# Patient Record
Sex: Male | Born: 1985 | Race: White | Marital: Single | Smoking: Never smoker
Health system: Southern US, Community
[De-identification: ages and names within clinical notes are randomized; demographics above are authoritative.]

## PROBLEM LIST (undated history)

## (undated) DIAGNOSIS — M25562 Pain in left knee: Principal | ICD-10-CM

## (undated) HISTORY — DX: Pain in left knee: M25.562

## (undated) HISTORY — PX: KNEE SURGERY: SHX244

---

## 2006-01-11 HISTORY — PX: WISDOM TOOTH EXTRACTION: SHX21

## 2011-06-24 ENCOUNTER — Other Ambulatory Visit: Payer: Self-pay | Admitting: Occupational Medicine

## 2011-06-24 ENCOUNTER — Ambulatory Visit
Admission: RE | Admit: 2011-06-24 | Discharge: 2011-06-24 | Disposition: A | Discharge status: Home / Self Care | Payer: No Typology Code available for payment source | Source: Ambulatory Visit | Attending: Occupational Medicine | Admitting: Occupational Medicine

## 2011-06-24 DIAGNOSIS — Z021 Encounter for pre-employment examination: Secondary | ICD-10-CM

## 2014-11-22 ENCOUNTER — Ambulatory Visit (INDEPENDENT_AMBULATORY_CARE_PROVIDER_SITE_OTHER): Payer: 59

## 2014-11-22 ENCOUNTER — Ambulatory Visit (INDEPENDENT_AMBULATORY_CARE_PROVIDER_SITE_OTHER): Payer: 59 | Admitting: Osteopathic Medicine

## 2014-11-22 ENCOUNTER — Encounter: Payer: Self-pay | Admitting: Osteopathic Medicine

## 2014-11-22 VITALS — BP 144/76 | HR 65 | Ht 69.0 in | Wt 160.0 lb

## 2014-11-22 DIAGNOSIS — M25562 Pain in left knee: Secondary | ICD-10-CM | POA: Diagnosis not present

## 2014-11-22 DIAGNOSIS — M25462 Effusion, left knee: Secondary | ICD-10-CM | POA: Diagnosis not present

## 2014-11-22 HISTORY — DX: Pain in left knee: M25.562

## 2014-11-22 MED ORDER — TRAMADOL HCL 50 MG PO TABS: 50.0000 mg | ORAL_TABLET | Freq: Four times a day (QID) | ORAL | Status: AC | PRN

## 2014-11-22 NOTE — Progress Notes (Signed)
HPI: Austin Roman is a 29 y.o. male who presents [facility]  today for chief complaint of: No chief complaint on file.  . Location: L knee.  . Quality: soreness, "pain" . Severity: moderate to severe . Duration: 7 years . Timing: worse with motion, otherwise ok . 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  . Modifying factors: Tylenol and Ibuprofen not really helping.  . Assoc signs/symptoms: no falling, no locking   Past medical, social and family history reviewed: History reviewed. No pertinent past medical history. History reviewed. No pertinent past surgical history. Social History  Substance Use Topics  . Smoking status: Not on file  . Smokeless tobacco: Not on file  . Alcohol Use: Not on file   History reviewed. No pertinent family history.  No current outpatient prescriptions on file.   No current facility-administered medications for this visit.   No Known Allergies    Review of Systems: CONSTITUTIONAL:  No  fever, no chills, No  unintentional weight changes HEAD/EYES/EARS/NOSE/THROAT: No headache, no vision change, no hearing change, No  sore throat CARDIAC: No chest pain, no pressure/palpitations, no orthopnea RESPIRATORY: No  cough, No  shortness of breath/wheeze GASTROINTESTINAL: No nausea, no vomiting, no abdominal pain, no blood in stool, no diarrhea, no constipation MUSCULOSKELETAL: Yes  myalgia/arthralgia GENITOURINARY: No incontinence, No abnormal genital bleeding/discharge SKIN: No rash/wounds/concerning lesions HEM/ONC: No easy bruising/bleeding, no abnormal lymph node ENDOCRINE: No polyuria/polydipsia/polyphagia, no heat/cold intolerance  NEUROLOGIC: No weakness, no dizziness, no slurred speech PSYCHIATRIC: No concerns with depression, no concerns with anxiety, no sleep problems    Exam:  There were no vitals taken for this visit. Constitutional: VSS, see  above. General Appearance: alert, well-developed, well-nourished, NAD Eyes: Normal lids and conjunctive, non-icteric sclera,  Ears, Nose, Mouth, Throat: Normal external inspection ears/nares/mouth/lips/gums, TM exam deferred bilaterally, MMM, Respiratory: Normal respiratory effort. no wheeze, no rhonchi, no rales Cardiovascular: S1/S2 normal, no murmur, no rub/gallop auscultated. RRR.  Musculoskeletal: Gait normal. No clubbing/cyanosis of digits.  Neurological: No cranial nerve deficit on limited exam. Motor and sensation intact and symmetric Psychiatric: Normal judgment/insight. Normal mood and affect. Oriented x3.  KNEE EXAM:  Lachman (ACL): Neg Posterior Drawer (PCL): Neg Varus test (LCL): Neg Valgus test (MCL): Neg Apley test (meniscus nonspecific): Pos McMurray test (meniscus): Pos especially lateral knee Patella Apprehension: Neg Ober test (ITB): Neg     No results found for this or any previous visit (from the past 72 hour(s)).    ASSESSMENT/PLAN:  Left lateral knee pain - Plan: DG Knee 4 Views W/Patella Left, MR Knee Left  Wo Contrast, traMADol (ULTRAM) 50 MG tablet   Suspect meniscal tear, will obtain imaging. Patient advised knee injections are an option, he will think about this and consider setting up appointment with either me or one of the sports medicine physicians in the practice. Patient instructions printed regarding home care. Patient cleared to return to work with restrictions on squatting/kneeling motions and climbing. Patient advised to use tramadol sparingly, otherwise continue over-the-counter NSAIDs/acetaminophen, rest and ice, home exercises reviewed. Patient advised to call as if he has not heard about x-ray results were setting up an MRI by next week. Pending MRI results, may continue referral to orthopedics versus physical therapy versus other.   Return if symptoms worsen or fail to improve, for and at convenience for ANNUAL PHYSICAL.

## 2014-11-22 NOTE — Patient Instructions (Signed)
Rest the knee. Avoid positions and activities that place excessive pressure on the knee joint until pain and swelling resolve. Such activities include: squatting, kneeling, twisting and pivoting, repetitive bending (eg, stairs, getting out of a seated position, clutch and pedal pushing), jogging, dancing, and swimming using the frog or whip kick. Apply ice to the knee for 15 minutes every four to six hours, while keeping the leg elevated. Exercises attached - strengthen quadriceps to take pressure off the knee joint.

## 2014-12-02 ENCOUNTER — Ambulatory Visit (INDEPENDENT_AMBULATORY_CARE_PROVIDER_SITE_OTHER): Payer: 59

## 2014-12-02 DIAGNOSIS — M25562 Pain in left knee: Secondary | ICD-10-CM

## 2015-11-15 ENCOUNTER — Emergency Department (HOSPITAL_COMMUNITY)
Admission: EM | Admit: 2015-11-15 | Discharge: 2015-11-15 | Disposition: A | Discharge status: Home / Self Care | Payer: Worker's Compensation | Attending: Emergency Medicine | Admitting: Emergency Medicine

## 2015-11-15 ENCOUNTER — Emergency Department (HOSPITAL_COMMUNITY): Payer: Worker's Compensation

## 2015-11-15 DIAGNOSIS — W1830XA Fall on same level, unspecified, initial encounter: Secondary | ICD-10-CM | POA: Diagnosis not present

## 2015-11-15 DIAGNOSIS — Y990 Civilian activity done for income or pay: Secondary | ICD-10-CM | POA: Diagnosis not present

## 2015-11-15 DIAGNOSIS — S8992XA Unspecified injury of left lower leg, initial encounter: Secondary | ICD-10-CM | POA: Diagnosis not present

## 2015-11-15 DIAGNOSIS — Y929 Unspecified place or not applicable: Secondary | ICD-10-CM | POA: Insufficient documentation

## 2015-11-15 DIAGNOSIS — Y939 Activity, unspecified: Secondary | ICD-10-CM | POA: Insufficient documentation

## 2015-11-15 MED ORDER — KETOROLAC TROMETHAMINE 60 MG/2ML IM SOLN
60.0000 mg | Freq: Once | INTRAMUSCULAR | Status: AC
  Administered 2015-11-15: 60 mg via INTRAMUSCULAR
  Filled 2015-11-15: qty 2

## 2015-11-15 NOTE — Progress Notes (Signed)
Orthopedic Tech Progress Note Patient Details:  Austin Roman 1985-06-01 [id number]  Ortho Devices Type of Ortho Device: Crutches, Knee Immobilizer Ortho Device/Splint Location: lle Ortho Device/Splint Interventions: Ordered, Application   [name], [name] 11/15/2015, 7:24 PM

## 2015-11-15 NOTE — ED Notes (Signed)
Pt understood dc material. NAD noted. Crutches nad immobilizer placed by ortho tech prior to dc

## 2015-11-15 NOTE — ED Provider Notes (Signed)
[facility]-EMERGENCY DEPT Provider Note   CSN: [id number] Arrival date & time: 11/15/15  1705  History   Chief Complaint Chief Complaint  Patient presents with  . Knee Pain   HPI Austin Roman is a 30 y.o. male.   Knee Pain   This is a new problem. The current episode started 1 to 2 hours ago. The problem occurs constantly. The problem has not changed since onset.The quality of the pain is described as aching. The pain is at a severity of 7/10. The pain is moderate. Pertinent negatives include no numbness, no stiffness and no tingling.   Past Medical History:  Diagnosis Date  . Left lateral knee pain 11/22/2014   Suspect meniscal tear based on exam, await MRI results as of 11/22/2014   Patient Active Problem List   Diagnosis Date Noted  . Left lateral knee pain 11/22/2014   Past Surgical History:  Procedure Laterality Date  . WISDOM TOOTH EXTRACTION  2008     Home Medications    Prior to Admission medications   Medication Sig Start Date End Date Taking? Authorizing Provider  traMADol (ULTRAM) 50 MG tablet Take 1 tablet (50 mg total) by mouth every 6 (six) hours as needed for severe pain. Use sparingly 11/22/14   Sunnie NielsenNatalie Alexander, DO    Family History Family History  Problem Relation Age of Onset  . Cancer Father     SKIN  . Hypertension Maternal Grandmother   . Cancer Maternal Grandfather     PROSTATE  . Hypertension Maternal Grandfather     Social History Social History  Substance Use Topics  . Smoking status: Never Smoker  . Smokeless tobacco: Not on file  . Alcohol use Not on file     Allergies   Review of patient's allergies indicates no known allergies.   Review of Systems Review of Systems  Musculoskeletal: Negative for stiffness.  Neurological: Negative for tingling and numbness.  All other systems reviewed and are negative.    Physical Exam Updated Vital Signs BP 149/87 (BP Location: Right Arm)   Pulse 94   Temp 98.1 F (36.7 C) (Oral)    Resp 20   SpO2 97%   Physical Exam  Constitutional: He appears well-developed and well-nourished. No distress.  HENT:  Head: Normocephalic.  Eyes: EOM are normal. Pupils are equal, round, and reactive to light.  Neck: Normal range of motion.  Cardiovascular: Normal rate and regular rhythm.   Pulmonary/Chest: Effort normal.  Abdominal: He exhibits no distension.  Musculoskeletal: Normal range of motion.  Decreased ROM of left knee due to pain.   Point tenderness to medial aspect joint line without crepitance.   No obvious laxity with valgus/varus stress. Negative anterior drawer sign.   Neurological: He is alert.  Skin: Skin is warm. He is not diaphoretic. No erythema.  Psychiatric: He has a normal mood and affect. His behavior is normal.  Nursing note and vitals reviewed.  ED Treatments / Results  Radiology No results found.  Procedures Procedures (including critical care time)  Medications Ordered in ED Medications - No data to display   Initial Impression / Assessment and Plan / ED Course  I have reviewed the triage vital signs and the nursing notes.  Pertinent labs & imaging results that were available during my care of the patient were reviewed by me and considered in my medical decision making (see chart for details).  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  Underlying patient has history of left knee pain and MRI which was -2016 but states that this is different in onset. He had complete resolution of his previous pain.  Patient has no neurological deficits. Obvious deformity. Pain along the medial aspect left knee.  MCL tear possible vs strain. X-rays ordered.  X-rays negative. Will apply knee immobilizer for comfort. RICE therapy encouraged. Tylenol and motrin for pain.   Will have Orthopedic follow up for MRI if pain persists.   Patient in agreement  with plan and discharged home in good health.   Final diagnoses:  Left knee injury, initial encounter     Deirdre PeerJeremiah Rhea Thrun, MD 11/15/15 1844    Cathren LaineKevin Steinl, MD 11/15/15 2102

## 2016-07-27 IMAGING — MR MR KNEE*L* W/O CM
5 series · 40 of 40 positions shown · non-contrast
Comparison: Radiographs dated 11/22/2014

CLINICAL DATA: Constant left knee pain for 4 months.

EXAM:
MRI OF THE LEFT KNEE WITHOUT CONTRAST
TECHNIQUE: Multiplanar, multisequence MR imaging of the knee was performed. No
intravenous contrast was administered.

[Series 3: PD fat-sat · axial · 3.0mm · 0.31mm/px · z∈[-72,+37]mm · 8 of 34 slices shown (1 of 3)]
[im 1/34]
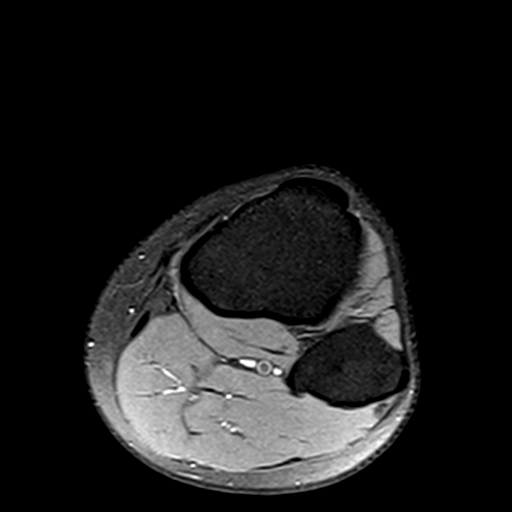
[im 5/34]
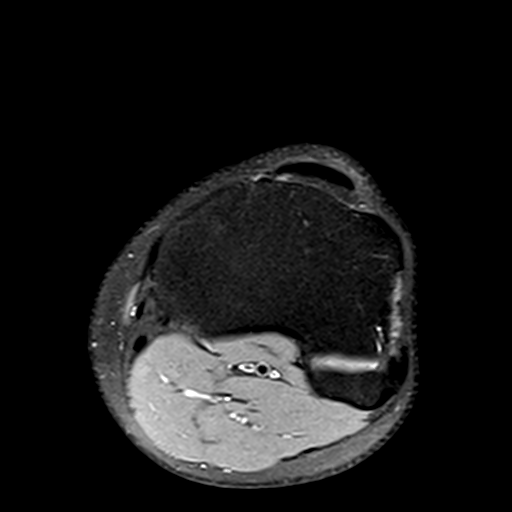
[im 10/34]
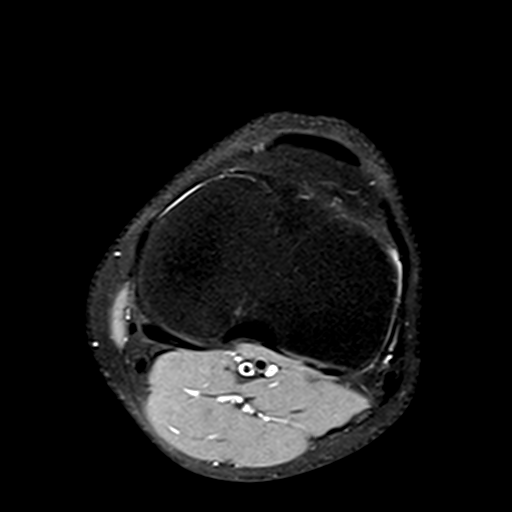
[im 15/34]
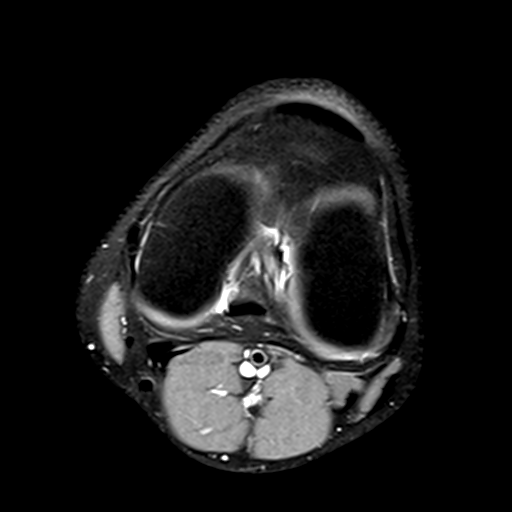
[im 19/34]
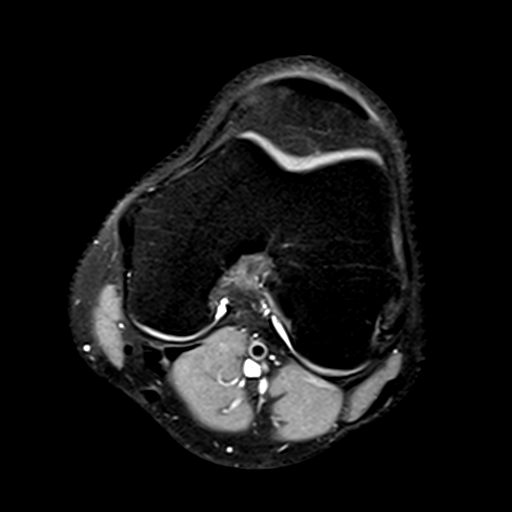
[im 24/34]
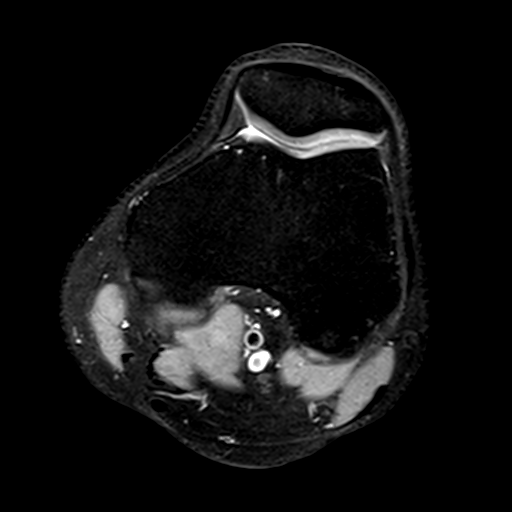
[im 29/34]
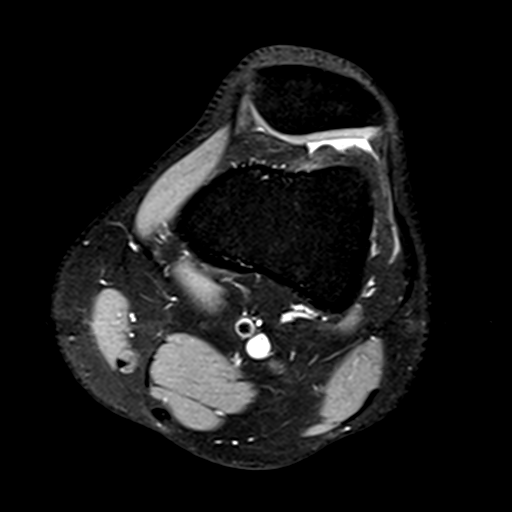
[im 34/34]
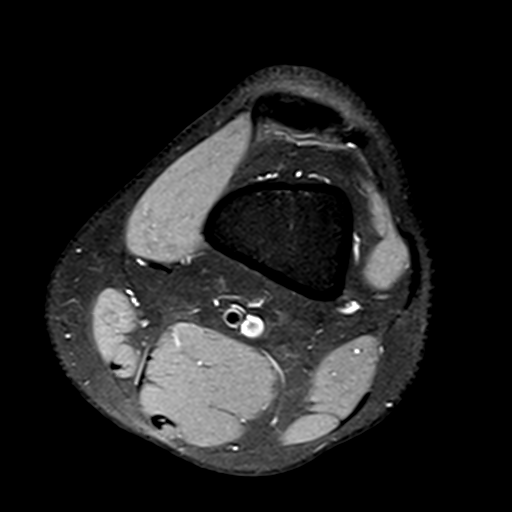

[Series 4: T1 · coronal · 3.0mm · 0.50mm/px · 8 of 32 slices shown]
[im 1/32]
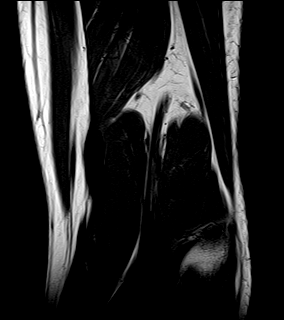
[im 5/32]
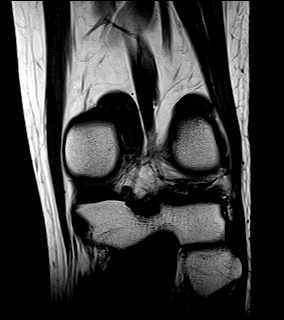
[im 9/32]
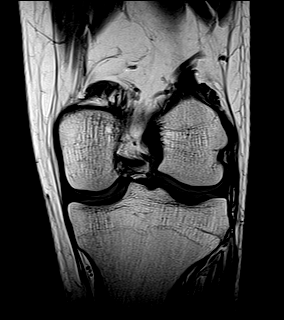
[im 14/32]
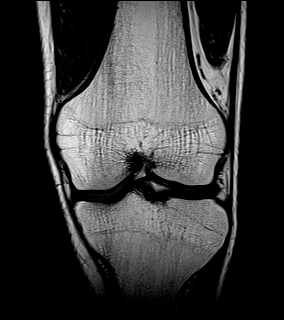
[im 18/32]
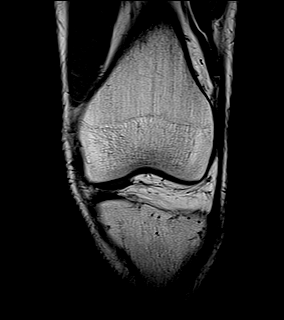
[im 23/32]
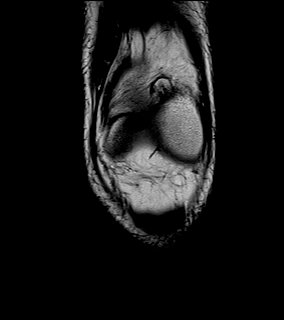
[im 27/32]
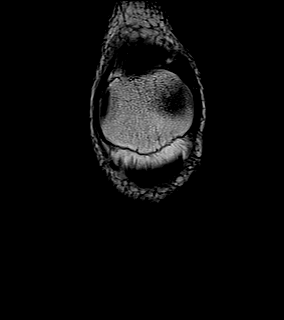
[im 32/32]
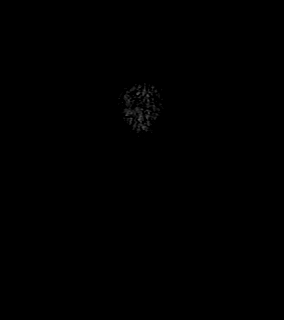

[Series 5: PD fat-sat · sagittal · 3.0mm · 0.62mm/px · 8 of 31 slices shown (2 of 3)]
[im 1/31]
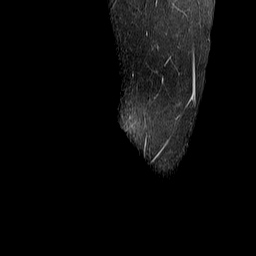
[im 5/31]
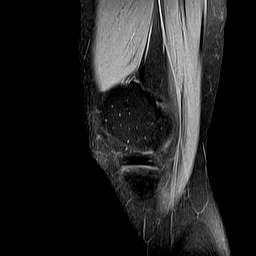
[im 9/31]
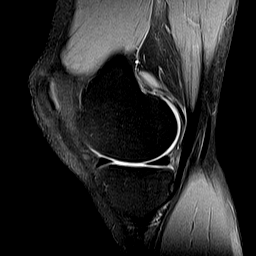
[im 13/31]
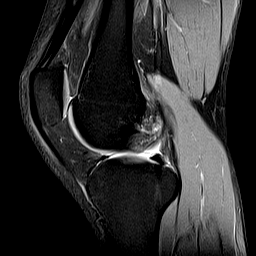
[im 18/31]
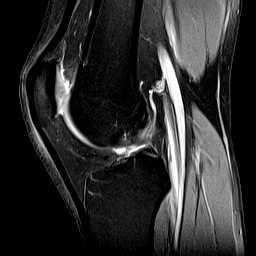
[im 22/31]
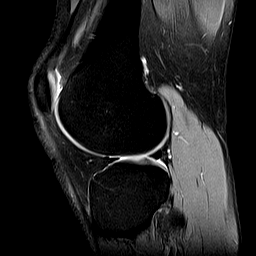
[im 26/31]
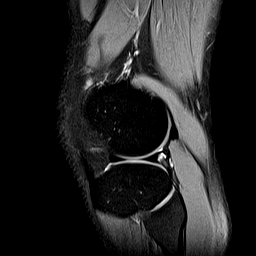
[im 31/31]
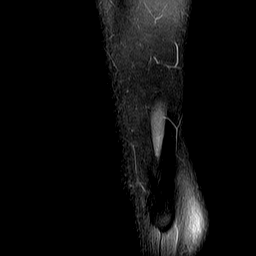

[Series 6: T2 fat-sat · coronal · 3.0mm · 0.50mm/px · 8 of 32 slices shown]
[im 1/32]
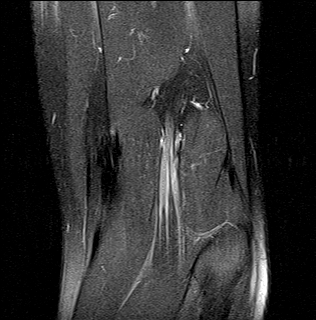
[im 5/32]
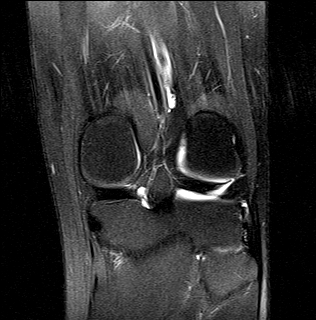
[im 9/32]
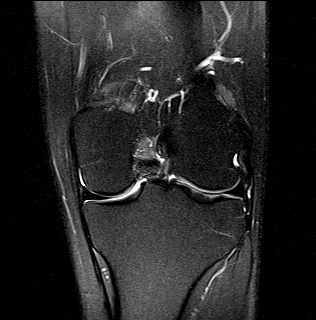
[im 14/32]
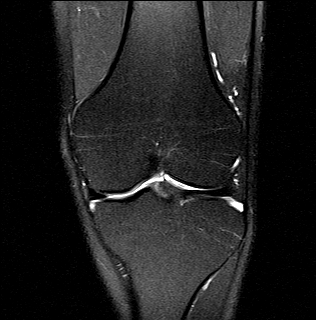
[im 18/32]
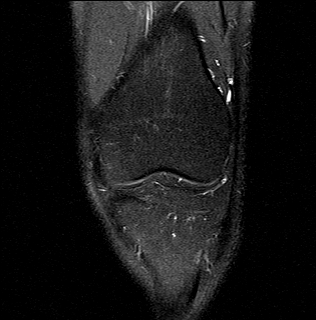
[im 23/32]
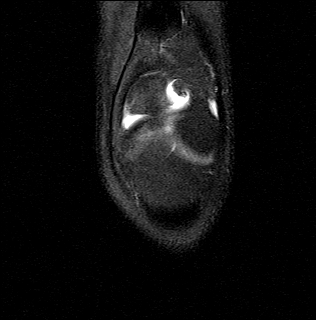
[im 27/32]
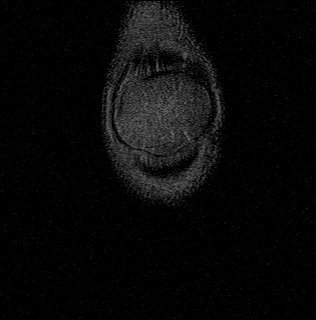
[im 32/32]
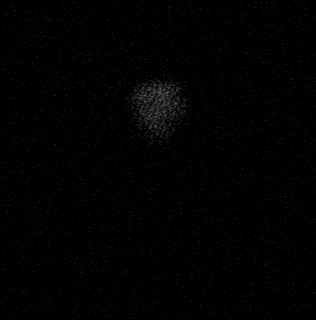

[Series 7: PD fat-sat · coronal · 3.0mm · 0.62mm/px · 8 of 32 slices shown (3 of 3)]
[im 1/32]
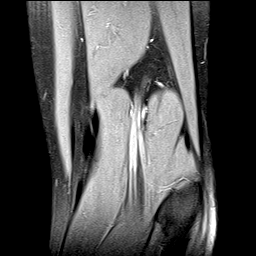
[im 5/32]
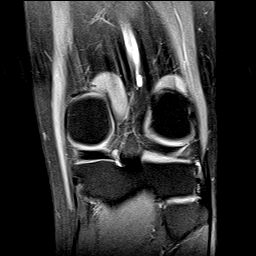
[im 9/32]
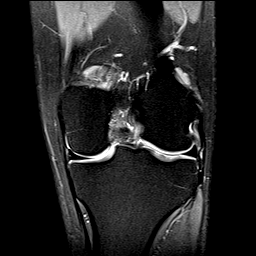
[im 14/32]
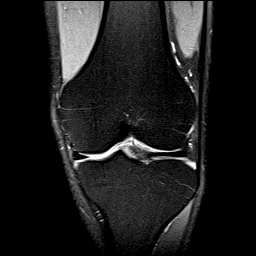
[im 18/32]
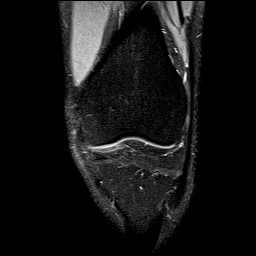
[im 23/32]
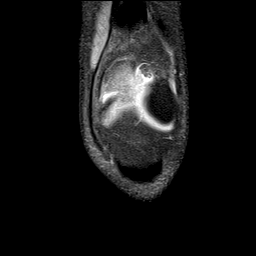
[im 27/32]
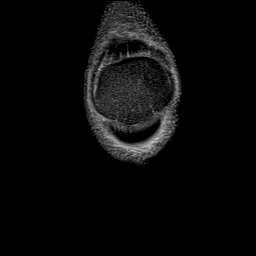
[im 32/32]
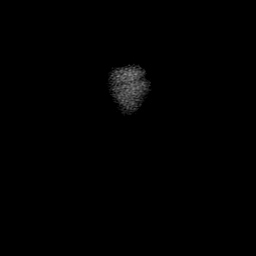

[40 of 40 positions shown; findings below may reference images not displayed]

FINDINGS: MENISCI

Medial meniscus:  Normal.

Lateral meniscus:  Normal.

LIGAMENTS

Cruciates:  Normal.

Collaterals:  Normal.

CARTILAGE

Patellofemoral:  Normal.

Medial:  Normal.

Lateral:  Normal.

Joint:  Normal.

Popliteal Fossa:  Normal.

Extensor Mechanism:  Normal.

Bones:  Normal.
IMPRESSION: Normal MRI of the left knee.

## 2017-08-17 ENCOUNTER — Emergency Department (HOSPITAL_COMMUNITY)
Admission: EM | Admit: 2017-08-17 | Discharge: 2017-08-17 | Disposition: A | Discharge status: Home / Self Care | Payer: No Typology Code available for payment source | Attending: Emergency Medicine | Admitting: Emergency Medicine

## 2017-08-17 ENCOUNTER — Encounter (HOSPITAL_COMMUNITY): Payer: Self-pay | Admitting: Emergency Medicine

## 2017-08-17 DIAGNOSIS — S199XXA Unspecified injury of neck, initial encounter: Secondary | ICD-10-CM | POA: Diagnosis present

## 2017-08-17 DIAGNOSIS — Y929 Unspecified place or not applicable: Secondary | ICD-10-CM | POA: Insufficient documentation

## 2017-08-17 DIAGNOSIS — Y9389 Activity, other specified: Secondary | ICD-10-CM | POA: Diagnosis not present

## 2017-08-17 DIAGNOSIS — Y990 Civilian activity done for income or pay: Secondary | ICD-10-CM | POA: Diagnosis not present
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
## 2017-08-17 NOTE — ED Notes (Signed)
Declined W/C at D/C and was escorted to lobby by RN. 

## 2017-08-17 NOTE — Discharge Instructions (Addendum)
Please read attached information. If you experience any new or worsening signs or symptoms please return to the emergency room for evaluation. Please follow-up with your primary care provider or specialist as discussed.  °

## 2018-12-05 ENCOUNTER — Ambulatory Visit: Payer: No Typology Code available for payment source | Admitting: Osteopathic Medicine

## 2020-10-14 ENCOUNTER — Other Ambulatory Visit: Payer: Self-pay | Admitting: Nurse Practitioner

## 2020-10-14 ENCOUNTER — Ambulatory Visit
Admission: RE | Admit: 2020-10-14 | Discharge: 2020-10-14 | Disposition: A | Discharge status: Home / Self Care | Payer: Worker's Compensation | Source: Ambulatory Visit | Attending: Nurse Practitioner | Admitting: Nurse Practitioner

## 2020-10-14 DIAGNOSIS — M25571 Pain in right ankle and joints of right foot: Secondary | ICD-10-CM

## 2021-02-19 ENCOUNTER — Encounter (HOSPITAL_BASED_OUTPATIENT_CLINIC_OR_DEPARTMENT_OTHER): Payer: Self-pay | Admitting: *Deleted

## 2021-02-19 ENCOUNTER — Other Ambulatory Visit: Payer: Self-pay

## 2021-02-19 ENCOUNTER — Emergency Department (HOSPITAL_BASED_OUTPATIENT_CLINIC_OR_DEPARTMENT_OTHER): Payer: No Typology Code available for payment source

## 2021-02-19 ENCOUNTER — Emergency Department (HOSPITAL_BASED_OUTPATIENT_CLINIC_OR_DEPARTMENT_OTHER)
Admission: EM | Admit: 2021-02-19 | Discharge: 2021-02-19 | Disposition: A | Discharge status: Home / Self Care | Payer: No Typology Code available for payment source | Attending: Emergency Medicine | Admitting: Emergency Medicine

## 2021-02-19 DIAGNOSIS — X501XXA Overexertion from prolonged static or awkward postures, initial encounter: Secondary | ICD-10-CM | POA: Diagnosis not present

## 2021-02-19 DIAGNOSIS — Y9302 Activity, running: Secondary | ICD-10-CM | POA: Insufficient documentation

## 2021-02-19 DIAGNOSIS — S80212A Abrasion, left knee, initial encounter: Secondary | ICD-10-CM | POA: Insufficient documentation

## 2021-02-19 DIAGNOSIS — S8992XA Unspecified injury of left lower leg, initial encounter: Secondary | ICD-10-CM | POA: Diagnosis present

## 2021-02-19 DIAGNOSIS — M25562 Pain in left knee: Secondary | ICD-10-CM

## 2021-02-19 NOTE — ED Notes (Signed)
Patient transported to X-ray 
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
## 2021-02-19 NOTE — Discharge Instructions (Signed)
You will need to follow-up with orthopedic doctor in 1 to 2 weeks in the office to have your knee reexamined after some of the swelling is gone down.  It is possible that you have an injury to one of the ligaments or meniscus in your knee.  Your x-ray did not show a fracture in the ER.  I recommended the safest plan of action is to wear the knee immobilizer in the daytime (you can take it off when showering and at night in bed), and use crutches, you can be light weightbearing on the left foot.  Please follow-up with your orthopedic doctor for clearance to return to your regular duties.

## 2021-02-19 NOTE — ED Triage Notes (Signed)
C/o left knee injury x 45 mins ago while at work  twisting landing on knee , abrasion to knee

## 2022-10-15 IMAGING — CR DG KNEE COMPLETE 4+V*L*
4 series · 4 of 4 positions shown · non-contrast
Comparison: 11/15/2015

CLINICAL DATA: Knee injury

EXAM:
LEFT KNEE - COMPLETE 4+ VIEW

[t knee ap left]
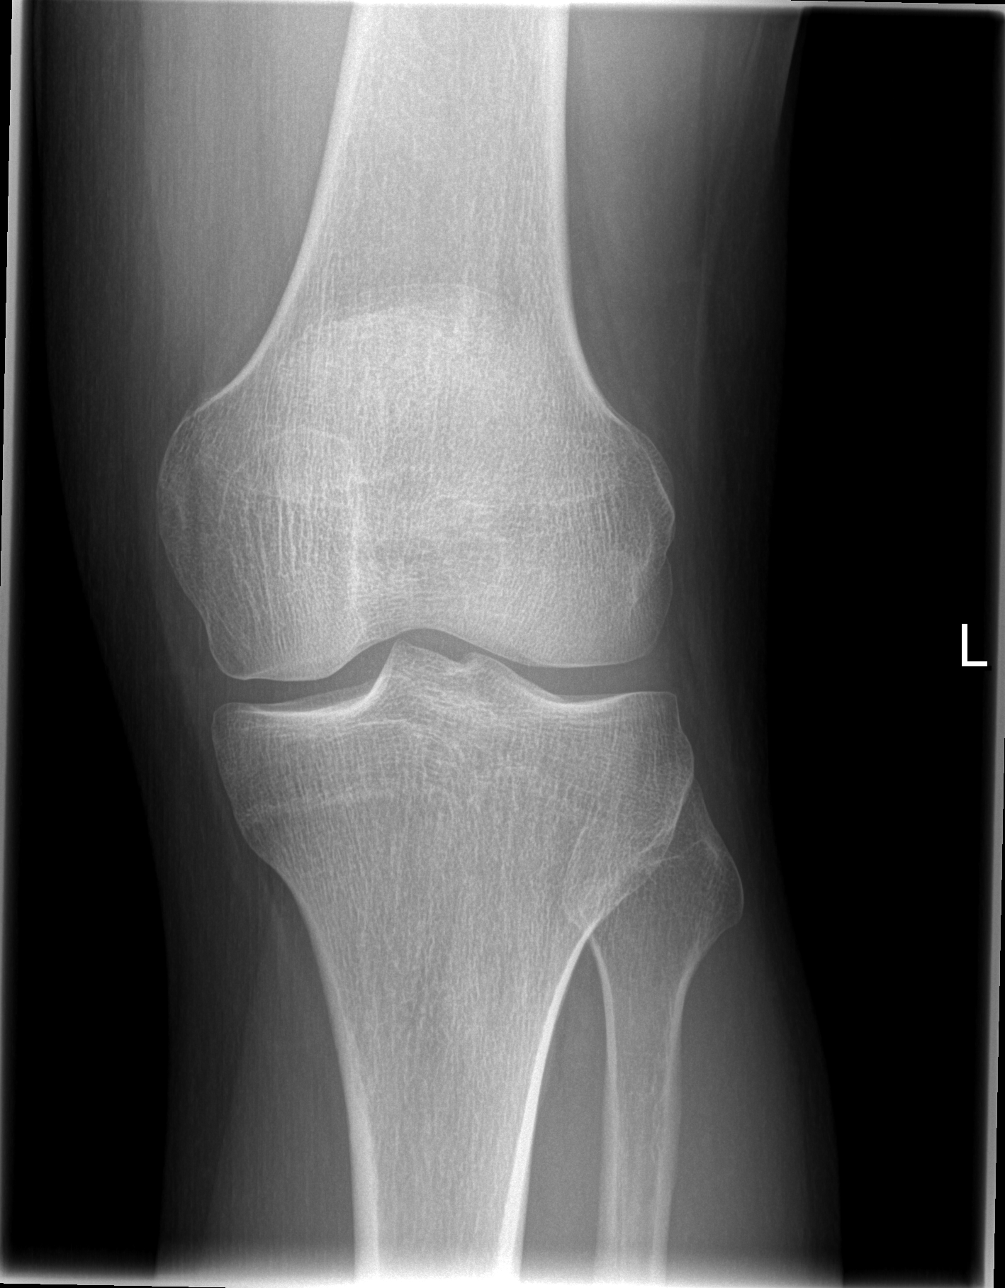

[t knee oblique left (1 of 2)]
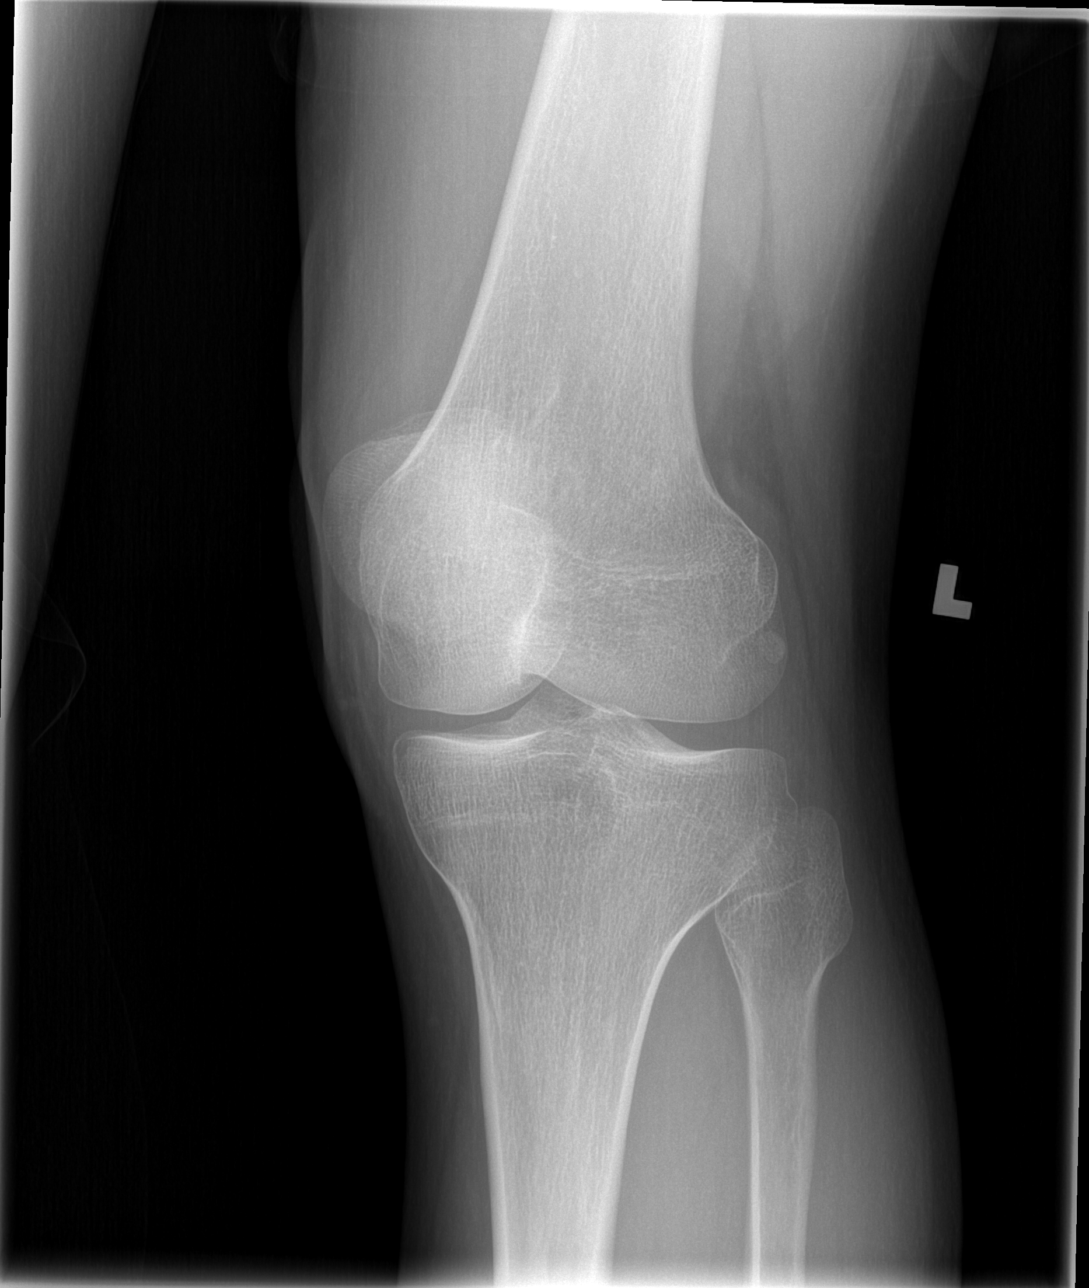

[t knee oblique left (2 of 2)]
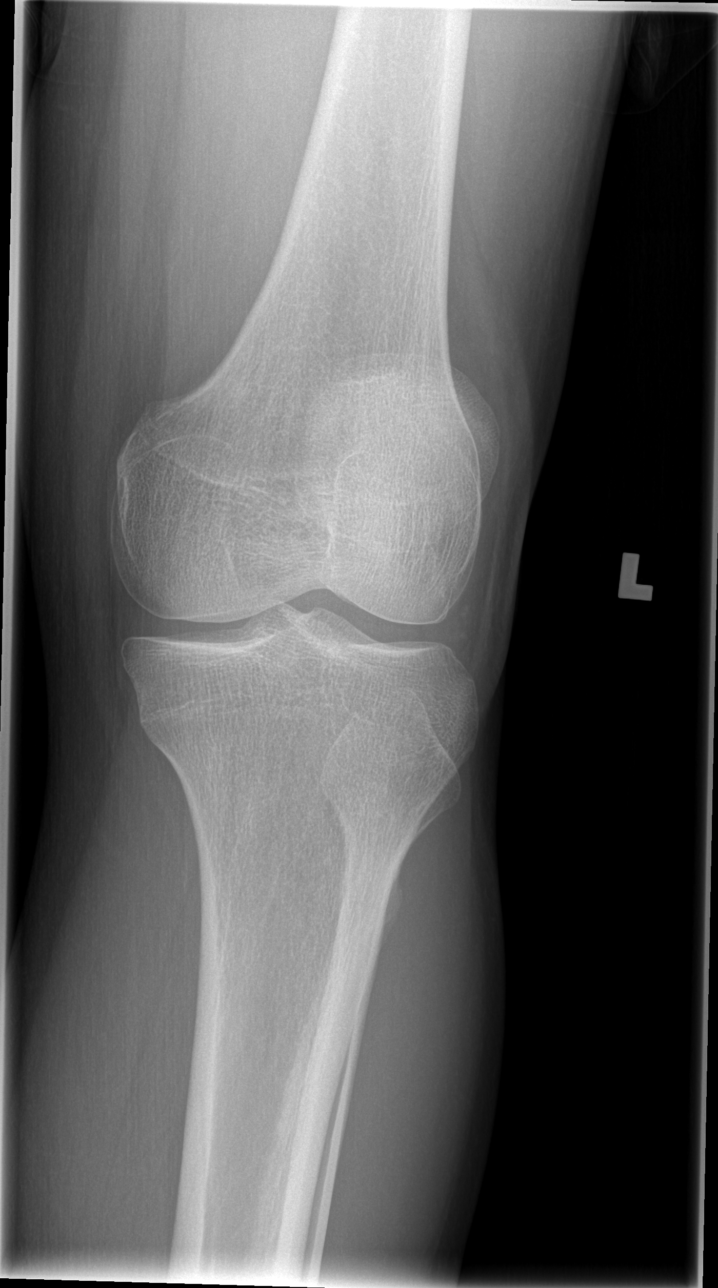

[t knee lat left]
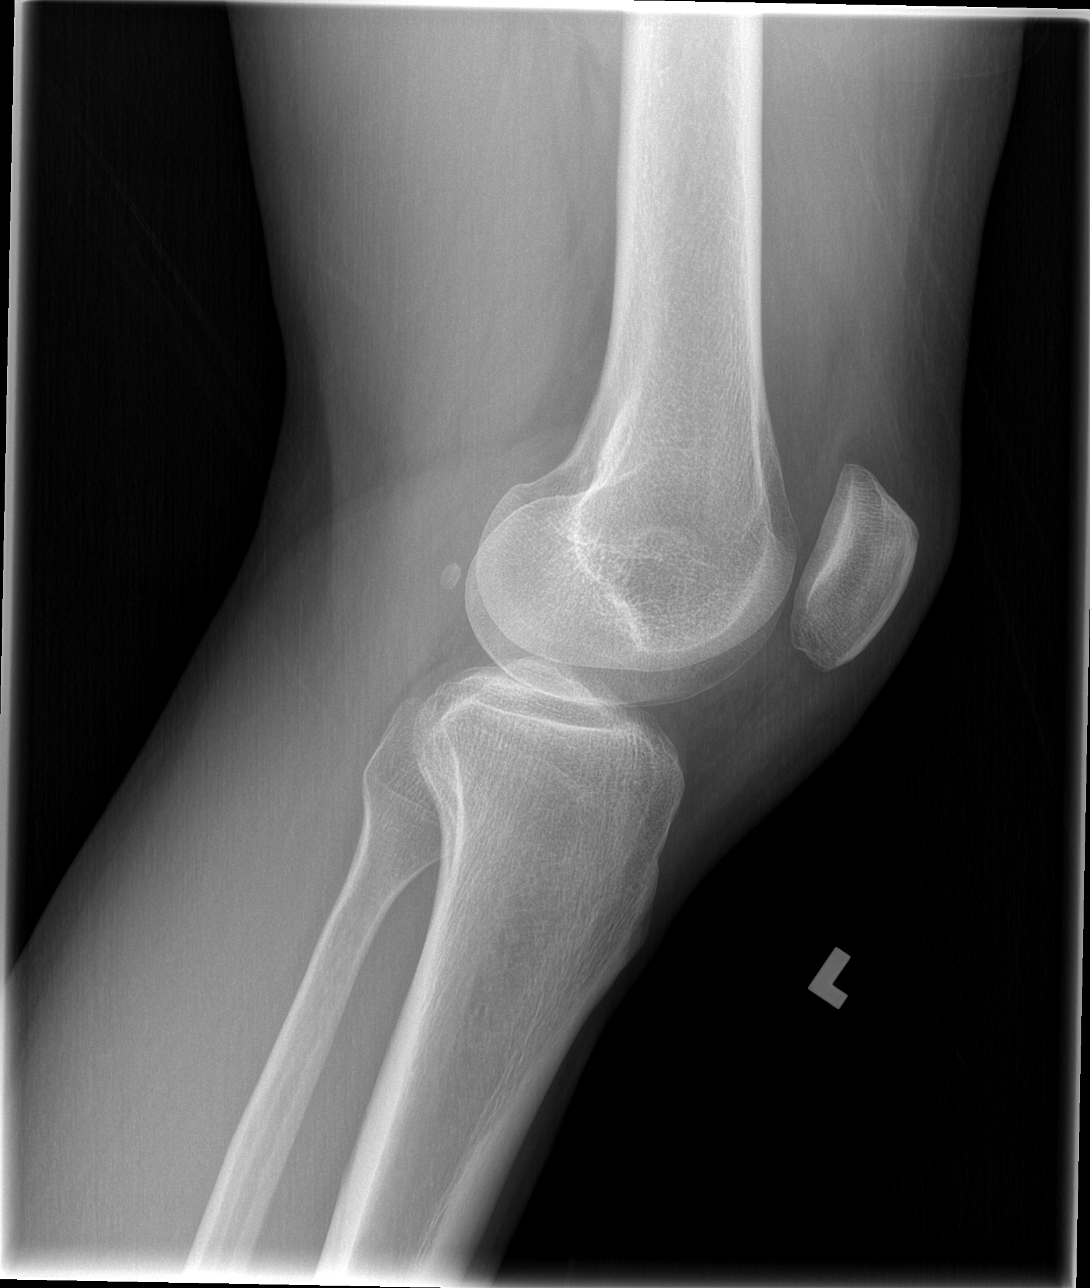

[4 of 4 positions shown; findings below may reference images not displayed]

FINDINGS: Trace effusion. No fracture or malalignment. Joint spaces are patent
IMPRESSION: Trace effusion
